# Patient Record
Sex: Female | Born: 1945 | Race: White | Hispanic: No | Marital: Single | Smoking: Never smoker
Health system: Southern US, Community
[De-identification: ages and names within clinical notes are randomized; demographics above are authoritative.]

## PROBLEM LIST (undated history)

## (undated) DIAGNOSIS — Z973 Presence of spectacles and contact lenses: Secondary | ICD-10-CM

## (undated) DIAGNOSIS — I10 Essential (primary) hypertension: Secondary | ICD-10-CM

## (undated) DIAGNOSIS — K219 Gastro-esophageal reflux disease without esophagitis: Secondary | ICD-10-CM

## (undated) DIAGNOSIS — Z8489 Family history of other specified conditions: Secondary | ICD-10-CM

## (undated) DIAGNOSIS — E7800 Pure hypercholesterolemia, unspecified: Secondary | ICD-10-CM

## (undated) DIAGNOSIS — Z974 Presence of external hearing-aid: Secondary | ICD-10-CM

## (undated) HISTORY — PX: HERNIA REPAIR: SHX51

## (undated) HISTORY — PX: TUBAL LIGATION: SHX77

## (undated) HISTORY — PX: VARICOSE VEIN SURGERY: SHX832

## (undated) HISTORY — PX: ABDOMINAL HYSTERECTOMY: SHX81

## (undated) SURGERY — ESOPHAGOGASTRODUODENOSCOPY (EGD) WITH PROPOFOL
Anesthesia: Monitor Anesthesia Care

---

## 2016-09-24 DIAGNOSIS — D1801 Hemangioma of skin and subcutaneous tissue: Secondary | ICD-10-CM | POA: Diagnosis not present

## 2016-09-24 DIAGNOSIS — D229 Melanocytic nevi, unspecified: Secondary | ICD-10-CM | POA: Diagnosis not present

## 2016-09-24 DIAGNOSIS — L309 Dermatitis, unspecified: Secondary | ICD-10-CM | POA: Diagnosis not present

## 2016-11-08 DIAGNOSIS — I10 Essential (primary) hypertension: Secondary | ICD-10-CM | POA: Diagnosis not present

## 2016-11-08 DIAGNOSIS — E785 Hyperlipidemia, unspecified: Secondary | ICD-10-CM | POA: Diagnosis not present

## 2016-11-09 DIAGNOSIS — E785 Hyperlipidemia, unspecified: Secondary | ICD-10-CM | POA: Diagnosis not present

## 2016-11-09 DIAGNOSIS — I10 Essential (primary) hypertension: Secondary | ICD-10-CM | POA: Diagnosis not present

## 2017-02-14 DIAGNOSIS — I10 Essential (primary) hypertension: Secondary | ICD-10-CM | POA: Diagnosis not present

## 2017-02-14 DIAGNOSIS — J309 Allergic rhinitis, unspecified: Secondary | ICD-10-CM | POA: Diagnosis not present

## 2017-02-14 DIAGNOSIS — E785 Hyperlipidemia, unspecified: Secondary | ICD-10-CM | POA: Diagnosis not present

## 2017-02-14 DIAGNOSIS — G4700 Insomnia, unspecified: Secondary | ICD-10-CM | POA: Diagnosis not present

## 2017-02-14 DIAGNOSIS — Z13820 Encounter for screening for osteoporosis: Secondary | ICD-10-CM | POA: Diagnosis not present

## 2017-02-14 DIAGNOSIS — Z0000 Encounter for general adult medical examination without abnormal findings: Secondary | ICD-10-CM | POA: Diagnosis not present

## 2017-02-14 DIAGNOSIS — Z23 Encounter for immunization: Secondary | ICD-10-CM | POA: Diagnosis not present

## 2017-02-14 DIAGNOSIS — Z1231 Encounter for screening mammogram for malignant neoplasm of breast: Secondary | ICD-10-CM | POA: Diagnosis not present

## 2017-02-19 ENCOUNTER — Other Ambulatory Visit: Payer: Self-pay | Admitting: Physician Assistant

## 2017-02-19 DIAGNOSIS — M8580 Other specified disorders of bone density and structure, unspecified site: Secondary | ICD-10-CM

## 2017-02-19 DIAGNOSIS — Z1231 Encounter for screening mammogram for malignant neoplasm of breast: Secondary | ICD-10-CM

## 2017-03-26 ENCOUNTER — Ambulatory Visit
Admission: RE | Admit: 2017-03-26 | Discharge: 2017-03-26 | Disposition: A | Discharge status: Home / Self Care | Payer: PPO | Source: Ambulatory Visit | Attending: Physician Assistant | Admitting: Physician Assistant

## 2017-03-26 DIAGNOSIS — Z1231 Encounter for screening mammogram for malignant neoplasm of breast: Secondary | ICD-10-CM

## 2017-03-26 DIAGNOSIS — M85851 Other specified disorders of bone density and structure, right thigh: Secondary | ICD-10-CM | POA: Diagnosis not present

## 2017-03-26 DIAGNOSIS — Z780 Asymptomatic menopausal state: Secondary | ICD-10-CM | POA: Diagnosis not present

## 2017-03-26 DIAGNOSIS — M8580 Other specified disorders of bone density and structure, unspecified site: Secondary | ICD-10-CM

## 2017-07-10 ENCOUNTER — Other Ambulatory Visit: Payer: Self-pay | Admitting: *Deleted

## 2017-07-10 NOTE — Patient Outreach (Signed)
[facility]) Care Management  07/10/2017  Carmen Alexander 02/23/1946 [id number]   Screening Call  Referral Date 07/10/17 Referral Source : [employer] for HTA Referral reason: Help member get setup with Optometrist for vision screening and nutritional consult.   Unsuccessful attempt to contact patient, able to leave a HIPAA compliant message with return call back number.   Plan Will plan return call within a week.   Joylene Draft, RN, [place] Management Coordinator  [phone]- Mobile [phone]- Toll Free Main Office

## 2017-07-11 ENCOUNTER — Other Ambulatory Visit: Payer: Self-pay | Admitting: *Deleted

## 2017-07-11 ENCOUNTER — Encounter: Payer: Self-pay | Admitting: *Deleted

## 2017-07-11 DIAGNOSIS — I10 Essential (primary) hypertension: Secondary | ICD-10-CM

## 2017-07-11 NOTE — Patient Outreach (Addendum)
[facility]) Care Management  07/11/2017  ADALEAH FORGET [birth year] [id number]  Referral Date 07/10/17 Referral Source : [employer] for HTA Referral reason: Help member get setup with Optometrist for vision screening and nutritional consult.    Patient returned call from outreach on yesterday, HIPAA information verified, explained reason for the call.  Social Patient lives at home with her son and daughter in law, she is independent with her care, she still drives, does the cooking for the family. Patient discussed she moved here about a 11/2 year ago, she as annual visit with PCP.   Conditions  Per patient discussion she reports high blood pressure and elevated cholesterol  as her medical conditions, she has a new blood pressure monitor and to begin checking her blood pressure at home but reports doing well at doctor visits.  Patient discussed she attends nutrition class monthly with her son, and is able to ask any question she wants because she does the cooking at home.   Medications  Patient discussed being on 2 medications , Crestor and Lisinopril/HCTZ, reports no cost concerns with affording  medications.   Annual follow up  Patient discussed she is due for a eye exam in next year and since moving here from [place]  she not had found a doctor yet . Patient states she has seen PCP for visit in spring 2018 and Wellness visit in July, 2018 and has scheduled next visit for spring next year.     Discussed [facility] care management services, discussed patient benefit of care manager for education and support of hypertension , benefit of nutrition counseling available ,  patient declined need for follow up services at this time. Patient states she just needs information of doctors available through her  insurance  on getting a vision exam for  next year    Plan Placed call to [facility] for patient , representative will send patient list of practices available for  eye exam, patient agreeable and  states this will meet her need.Veriifed with patient that she has HTA contact information . Will send successful outreach letter.  Will notify CMA to list patient as not active.     Joylene Draft, RN, [place] Management Coordinator  [phone]- Mobile [phone]- Toll Free Main Office

## 2017-07-12 ENCOUNTER — Ambulatory Visit: Payer: Self-pay | Admitting: *Deleted

## 2017-11-12 DIAGNOSIS — H903 Sensorineural hearing loss, bilateral: Secondary | ICD-10-CM | POA: Diagnosis not present

## 2017-11-27 DIAGNOSIS — H903 Sensorineural hearing loss, bilateral: Secondary | ICD-10-CM | POA: Diagnosis not present

## 2017-11-28 ENCOUNTER — Other Ambulatory Visit: Payer: Self-pay | Admitting: Gastroenterology

## 2017-11-28 ENCOUNTER — Other Ambulatory Visit: Payer: Self-pay

## 2017-11-28 ENCOUNTER — Encounter (HOSPITAL_COMMUNITY): Payer: Self-pay | Admitting: *Deleted

## 2017-11-28 DIAGNOSIS — R112 Nausea with vomiting, unspecified: Secondary | ICD-10-CM | POA: Diagnosis not present

## 2017-11-28 NOTE — Progress Notes (Signed)
Pt denies SOB, chest pain, and being under the care of a cardiologist. Pt made aware to stop taking vitamins, fish oil and herbal medications. Do not take any NSAIDs ie: Ibuprofen, Advil, Naproxen (Aleve), Motrin, BC and Goody Powder. Pt verbalized understanding of all pre-op instructions. 

## 2017-11-29 ENCOUNTER — Ambulatory Visit (HOSPITAL_COMMUNITY): Payer: PPO | Admitting: Certified Registered"

## 2017-11-29 ENCOUNTER — Encounter (HOSPITAL_COMMUNITY)
Admission: RE | Disposition: A | Discharge status: Home / Self Care | Payer: Self-pay | Source: Ambulatory Visit | Attending: Gastroenterology

## 2017-11-29 ENCOUNTER — Other Ambulatory Visit: Payer: Self-pay

## 2017-11-29 ENCOUNTER — Other Ambulatory Visit: Payer: Self-pay | Admitting: Gastroenterology

## 2017-11-29 ENCOUNTER — Encounter (HOSPITAL_COMMUNITY): Payer: Self-pay

## 2017-11-29 ENCOUNTER — Ambulatory Visit (HOSPITAL_COMMUNITY)
Admission: RE | Admit: 2017-11-29 | Discharge: 2017-11-29 | Disposition: A | Discharge status: Home / Self Care | Payer: PPO | Source: Ambulatory Visit | Attending: Gastroenterology | Admitting: Gastroenterology

## 2017-11-29 DIAGNOSIS — K2980 Duodenitis without bleeding: Secondary | ICD-10-CM | POA: Diagnosis not present

## 2017-11-29 DIAGNOSIS — E785 Hyperlipidemia, unspecified: Secondary | ICD-10-CM | POA: Diagnosis not present

## 2017-11-29 DIAGNOSIS — I10 Essential (primary) hypertension: Secondary | ICD-10-CM | POA: Insufficient documentation

## 2017-11-29 DIAGNOSIS — K209 Esophagitis, unspecified: Secondary | ICD-10-CM | POA: Diagnosis not present

## 2017-11-29 DIAGNOSIS — K210 Gastro-esophageal reflux disease with esophagitis: Secondary | ICD-10-CM | POA: Insufficient documentation

## 2017-11-29 DIAGNOSIS — K315 Obstruction of duodenum: Secondary | ICD-10-CM | POA: Insufficient documentation

## 2017-11-29 DIAGNOSIS — K449 Diaphragmatic hernia without obstruction or gangrene: Secondary | ICD-10-CM | POA: Diagnosis not present

## 2017-11-29 DIAGNOSIS — Z79899 Other long term (current) drug therapy: Secondary | ICD-10-CM | POA: Insufficient documentation

## 2017-11-29 DIAGNOSIS — K219 Gastro-esophageal reflux disease without esophagitis: Secondary | ICD-10-CM | POA: Insufficient documentation

## 2017-11-29 DIAGNOSIS — R112 Nausea with vomiting, unspecified: Secondary | ICD-10-CM | POA: Diagnosis not present

## 2017-11-29 HISTORY — DX: Family history of other specified conditions: Z84.89

## 2017-11-29 HISTORY — DX: Presence of spectacles and contact lenses: Z97.3

## 2017-11-29 HISTORY — DX: Presence of external hearing-aid: Z97.4

## 2017-11-29 HISTORY — DX: Essential (primary) hypertension: I10

## 2017-11-29 HISTORY — DX: Pure hypercholesterolemia, unspecified: E78.00

## 2017-11-29 HISTORY — DX: Gastro-esophageal reflux disease without esophagitis: K21.9

## 2017-11-29 HISTORY — PX: ESOPHAGOGASTRODUODENOSCOPY (EGD) WITH PROPOFOL: SHX5813

## 2017-11-29 SURGERY — ESOPHAGOGASTRODUODENOSCOPY (EGD) WITH PROPOFOL
Anesthesia: Monitor Anesthesia Care

## 2017-11-29 MED ORDER — MIDAZOLAM HCL 5 MG/5ML IJ SOLN
INTRAMUSCULAR | Status: DC | PRN
  Administered 2017-11-29: 1 mg via INTRAVENOUS

## 2017-11-29 MED ORDER — FENTANYL CITRATE (PF) 100 MCG/2ML IJ SOLN
INTRAMUSCULAR | Status: DC | PRN
  Administered 2017-11-29: 25 ug via INTRAVENOUS

## 2017-11-29 MED ORDER — BUTAMBEN-TETRACAINE-BENZOCAINE 2-2-14 % EX AERO
INHALATION_SPRAY | CUTANEOUS | Status: DC | PRN
  Administered 2017-11-29: 2 via TOPICAL

## 2017-11-29 MED ORDER — PROPOFOL 10 MG/ML IV BOLUS
INTRAVENOUS | Status: DC | PRN
  Administered 2017-11-29: 20 mg via INTRAVENOUS

## 2017-11-29 MED ORDER — LACTATED RINGERS IV SOLN
INTRAVENOUS | Status: DC
  Administered 2017-11-29: 1000 mL via INTRAVENOUS
  Administered 2017-11-29: 08:00:00 via INTRAVENOUS

## 2017-11-29 MED ORDER — PROPOFOL 500 MG/50ML IV EMUL
INTRAVENOUS | Status: DC | PRN
  Administered 2017-11-29: 50 ug/kg/min via INTRAVENOUS

## 2017-11-29 MED ORDER — ONDANSETRON HCL 4 MG/2ML IJ SOLN
INTRAMUSCULAR | Status: DC | PRN
  Administered 2017-11-29: 4 mg via INTRAVENOUS

## 2017-11-29 SURGICAL SUPPLY — 14 items

## 2017-11-29 NOTE — Anesthesia Postprocedure Evaluation (Signed)
Anesthesia Post Note  Patient: Carmen Alexander  Procedure(s) Performed: ESOPHAGOGASTRODUODENOSCOPY (EGD) WITH PROPOFOL (N/A )     Patient location during evaluation: PACU Anesthesia Type: MAC Level of consciousness: awake and alert Pain management: pain level controlled Vital Signs Assessment: post-procedure vital signs reviewed and stable Respiratory status: spontaneous breathing, nonlabored ventilation, respiratory function stable and patient connected to nasal cannula oxygen Cardiovascular status: stable and blood pressure returned to baseline Postop Assessment: no apparent nausea or vomiting Anesthetic complications: no    Last Vitals:  Vitals:   11/29/17 0935 11/29/17 0943  BP: (!) 173/75 (!) 167/77  Pulse: 73 75  Resp: 13   Temp:    SpO2: 100% 98%    Last Pain:  Vitals:   11/29/17 0940  TempSrc:   PainSc: 0-No pain                 Brittiany Wiehe DAVID

## 2017-11-29 NOTE — Discharge Instructions (Signed)

## 2017-11-29 NOTE — Anesthesia Procedure Notes (Signed)
Performed by: Ossey, Kevin, MD       

## 2017-11-29 NOTE — Consult Note (Signed)
The patient is a 72 year old female who presents to the endoscopy unit today at the hospital for EGD. She was seen in the office yesterday by Dr. Paulita Fujita with complaints of nausea and vomiting over the past few days associated with heartburn and reflux. Endoscopy was recommended to further evaluate. The patient states that she has had ulcers in the past. History and physical from the office reviewed.  Physical no acute distress  Heart regular rhythm no murmurs  Lungs clear  Abdomen soft and nontender  Impression: Nausea vomiting and heartburn  Plan: EGD

## 2017-11-29 NOTE — Anesthesia Procedure Notes (Signed)
Procedure Name: MAC Date/Time: 11/29/2017 8:58 AM Performed by: Lavell Luster, CRNA Pre-anesthesia Checklist: Patient identified, Emergency Drugs available, Suction available, Patient being monitored and Timeout performed Patient Re-evaluated:Patient Re-evaluated prior to induction Oxygen Delivery Method: Nasal cannula Preoxygenation: Pre-oxygenation with 100% oxygen Induction Type: IV induction Placement Confirmation: positive ETCO2 and breath sounds checked- equal and bilateral Dental Injury: Teeth and Oropharynx as per pre-operative assessment

## 2017-11-29 NOTE — Op Note (Signed)
[facility] Patient Name: Carmen Alexander Procedure Date : 11/29/2017 MRN: [id number] Attending MD: Wonda Horner , MD Date of Birth: 10/29/1945 CSN: [id number] Age: 72 Admit Type: Outpatient Procedure:                Upper GI endoscopy Indications:              Nausea with vomiting Providers:                Wonda Horner, MD, Baird Cancer, RN, Elspeth Cho Tech., Technician, Theodoro Grist, CRNA Referring MD:              Medicines:                Propofol per Anesthesia Complications:            No immediate complications. Estimated Blood Loss:     Estimated blood loss was minimal. Procedure:                Pre-Anesthesia Assessment:                           - Prior to the procedure, a History and Physical                            was performed, and patient medications and                            allergies were reviewed. The patient's tolerance of                            previous anesthesia was also reviewed. The risks                            and benefits of the procedure and the sedation                            options and risks were discussed with the patient.                            All questions were answered, and informed consent                            was obtained. Prior Anticoagulants: The patient has                            taken no previous anticoagulant or antiplatelet                            agents. ASA Grade Assessment: II - A patient with                            mild systemic disease. After reviewing the risks  and benefits, the patient was deemed in                            satisfactory condition to undergo the procedure.                           After obtaining informed consent, the endoscope was                            passed under direct vision. Throughout the                            procedure, the patient's blood pressure, pulse, and   oxygen saturations were monitored continuously. The                            EG-[id number] ([id number]) scope was introduced through the                            mouth, and advanced to the duodenal bulb. The upper                            GI endoscopy was accomplished without difficulty.                            The patient tolerated the procedure well. Scope In: Scope Out: Findings:      Severe esophagitis was found. Located in the distal and mid esophagus.       It was ulcerative. Biopsies were taken with a cold forceps for histology.      A medium-sized hiatal hernia was present.      A medium amount of food (residue) was found in the gastric body.      An acquired severe stenosis was found in the first portion of the       duodenum. I could see a small lumen but could not advance the scope       beyond. No ulcer was seen but the duodenal mucosa in the bulb was       somewhat nodular. Biopsies were taken with a cold forceps for histology. Impression:               - Severe reflux esophagitis. Biopsied.                           - Medium-sized hiatal hernia.                           - A medium amount of food (residue) in the stomach.                           - Acquired duodenal stenosis. Biopsied. Recommendation:           - Clear liquid diet.                           - Continue present medications.                           -  Await pathology results.                           - PPI therapy. Patient will get Nexium 24 and take                            qd.                           - Will order a CT scan of abdomen with and without                            contrast. Need to determine extent of stricture and                            also to see if this is from external compression of                            duodenum. Procedure Code(s):        --- Professional ---                           [id number], Esophagogastroduodenoscopy, flexible,                            transoral;  with biopsy, single or multiple Diagnosis Code(s):        --- Professional ---                           K21.0, Gastro-esophageal reflux disease with                            esophagitis                           K44.9, Diaphragmatic hernia without obstruction or                            gangrene                           K31.5, Obstruction of duodenum                           R11.2, Nausea with vomiting, unspecified CPT copyright 2017 American Medical Association. All rights reserved. The codes documented in this report are preliminary and upon coder review may  be revised to meet current compliance requirements. Wonda Horner, MD 11/29/2017 9:27:55 AM This report has been signed electronically. Number of Addenda: 0

## 2017-11-29 NOTE — Transfer of Care (Signed)
Immediate Anesthesia Transfer of Care Note  Patient: Carmen Alexander  Procedure(s) Performed: ESOPHAGOGASTRODUODENOSCOPY (EGD) WITH PROPOFOL (N/A )  Patient Location: Endoscopy Unit  Anesthesia Type:MAC  Level of Consciousness: awake, alert  and sedated  Airway & Oxygen Therapy: Patient connected to nasal cannula oxygen  Post-op Assessment: Post -op Vital signs reviewed and stable  Post vital signs: stable  Last Vitals:  Vitals Value Taken Time  BP    Temp    Pulse    Resp    SpO2      Last Pain:  Vitals:   11/29/17 0755  TempSrc: Oral  PainSc: 0-No pain         Complications: No apparent anesthesia complications

## 2017-11-29 NOTE — Anesthesia Preprocedure Evaluation (Addendum)
Anesthesia Evaluation  Patient identified by MRN, date of birth, ID band Patient awake    Reviewed: Allergy & Precautions, NPO status , Patient's Chart, lab work & pertinent test results  Airway Mallampati: I  TM Distance: >3 FB Neck ROM: Full    Dental   Pulmonary    Pulmonary exam normal        Cardiovascular hypertension, Pt. on medications Normal cardiovascular exam     Neuro/Psych    GI/Hepatic GERD  Medicated and Controlled,  Endo/Other    Renal/GU      Musculoskeletal   Abdominal   Peds  Hematology   Anesthesia Other Findings   Reproductive/Obstetrics                           Anesthesia Physical Anesthesia Plan  ASA: III  Anesthesia Plan: MAC   Post-op Pain Management:    Induction: Intravenous  PONV Risk Score and Plan: 2  Airway Management Planned: Simple Face Mask  Additional Equipment:   Intra-op Plan:   Post-operative Plan:   Informed Consent: I have reviewed the patients History and Physical, chart, labs and discussed the procedure including the risks, benefits and alternatives for the proposed anesthesia with the patient or authorized representative who has indicated his/her understanding and acceptance.     Plan Discussed with: CRNA and Surgeon  Anesthesia Plan Comments:         Anesthesia Quick Evaluation

## 2017-12-01 ENCOUNTER — Encounter (HOSPITAL_COMMUNITY): Payer: Self-pay | Admitting: Gastroenterology

## 2017-12-03 ENCOUNTER — Ambulatory Visit
Admission: RE | Admit: 2017-12-03 | Discharge: 2017-12-03 | Disposition: A | Discharge status: Home / Self Care | Payer: PPO | Source: Ambulatory Visit | Attending: Gastroenterology | Admitting: Gastroenterology

## 2017-12-03 DIAGNOSIS — K8020 Calculus of gallbladder without cholecystitis without obstruction: Secondary | ICD-10-CM | POA: Diagnosis not present

## 2017-12-03 DIAGNOSIS — K315 Obstruction of duodenum: Secondary | ICD-10-CM

## 2017-12-03 MED ORDER — IOPAMIDOL (ISOVUE-300) INJECTION 61%
100.0000 mL | Freq: Once | INTRAVENOUS | Status: AC | PRN
  Administered 2017-12-03: 100 mL via INTRAVENOUS

## 2017-12-05 DIAGNOSIS — K219 Gastro-esophageal reflux disease without esophagitis: Secondary | ICD-10-CM | POA: Diagnosis not present

## 2017-12-05 DIAGNOSIS — K315 Obstruction of duodenum: Secondary | ICD-10-CM | POA: Diagnosis not present

## 2018-01-06 DIAGNOSIS — K315 Obstruction of duodenum: Secondary | ICD-10-CM | POA: Diagnosis not present

## 2018-01-27 DIAGNOSIS — M545 Low back pain: Secondary | ICD-10-CM | POA: Diagnosis not present

## 2018-02-18 DIAGNOSIS — Z23 Encounter for immunization: Secondary | ICD-10-CM | POA: Diagnosis not present

## 2018-02-18 DIAGNOSIS — K219 Gastro-esophageal reflux disease without esophagitis: Secondary | ICD-10-CM | POA: Diagnosis not present

## 2018-02-18 DIAGNOSIS — E785 Hyperlipidemia, unspecified: Secondary | ICD-10-CM | POA: Diagnosis not present

## 2018-02-18 DIAGNOSIS — M8580 Other specified disorders of bone density and structure, unspecified site: Secondary | ICD-10-CM | POA: Diagnosis not present

## 2018-02-18 DIAGNOSIS — I10 Essential (primary) hypertension: Secondary | ICD-10-CM | POA: Diagnosis not present

## 2018-02-18 DIAGNOSIS — Z0000 Encounter for general adult medical examination without abnormal findings: Secondary | ICD-10-CM | POA: Diagnosis not present

## 2018-02-18 DIAGNOSIS — K315 Obstruction of duodenum: Secondary | ICD-10-CM | POA: Diagnosis not present

## 2018-02-18 DIAGNOSIS — R413 Other amnesia: Secondary | ICD-10-CM | POA: Diagnosis not present

## 2018-02-18 DIAGNOSIS — G4700 Insomnia, unspecified: Secondary | ICD-10-CM | POA: Diagnosis not present

## 2018-02-21 DIAGNOSIS — W19XXXA Unspecified fall, initial encounter: Secondary | ICD-10-CM | POA: Diagnosis not present

## 2018-02-21 DIAGNOSIS — R0781 Pleurodynia: Secondary | ICD-10-CM | POA: Diagnosis not present

## 2018-02-21 DIAGNOSIS — Y92481 Parking lot as the place of occurrence of the external cause: Secondary | ICD-10-CM | POA: Diagnosis not present

## 2018-02-21 DIAGNOSIS — S20211A Contusion of right front wall of thorax, initial encounter: Secondary | ICD-10-CM | POA: Diagnosis not present

## 2018-02-24 DIAGNOSIS — Z1212 Encounter for screening for malignant neoplasm of rectum: Secondary | ICD-10-CM | POA: Diagnosis not present

## 2018-02-24 DIAGNOSIS — Z1211 Encounter for screening for malignant neoplasm of colon: Secondary | ICD-10-CM | POA: Diagnosis not present

## 2018-03-03 DIAGNOSIS — K315 Obstruction of duodenum: Secondary | ICD-10-CM | POA: Diagnosis not present

## 2018-03-03 DIAGNOSIS — K449 Diaphragmatic hernia without obstruction or gangrene: Secondary | ICD-10-CM | POA: Diagnosis not present

## 2018-03-03 DIAGNOSIS — K2980 Duodenitis without bleeding: Secondary | ICD-10-CM | POA: Diagnosis not present

## 2018-03-05 DIAGNOSIS — K2980 Duodenitis without bleeding: Secondary | ICD-10-CM | POA: Diagnosis not present

## 2018-04-24 DIAGNOSIS — K315 Obstruction of duodenum: Secondary | ICD-10-CM | POA: Diagnosis not present

## 2018-04-24 DIAGNOSIS — Z1211 Encounter for screening for malignant neoplasm of colon: Secondary | ICD-10-CM | POA: Diagnosis not present

## 2018-04-24 DIAGNOSIS — K219 Gastro-esophageal reflux disease without esophagitis: Secondary | ICD-10-CM | POA: Diagnosis not present

## 2018-04-30 ENCOUNTER — Other Ambulatory Visit: Payer: Self-pay | Admitting: Family Medicine

## 2018-04-30 DIAGNOSIS — Z1231 Encounter for screening mammogram for malignant neoplasm of breast: Secondary | ICD-10-CM

## 2018-06-02 ENCOUNTER — Ambulatory Visit
Admission: RE | Admit: 2018-06-02 | Discharge: 2018-06-02 | Disposition: A | Discharge status: Home / Self Care | Payer: PPO | Source: Ambulatory Visit | Attending: Family Medicine | Admitting: Family Medicine

## 2018-06-02 DIAGNOSIS — Z1231 Encounter for screening mammogram for malignant neoplasm of breast: Secondary | ICD-10-CM | POA: Diagnosis not present

## 2018-08-01 ENCOUNTER — Other Ambulatory Visit: Payer: Self-pay | Admitting: Gastroenterology

## 2018-08-11 ENCOUNTER — Other Ambulatory Visit: Payer: Self-pay | Admitting: Gastroenterology

## 2018-08-12 ENCOUNTER — Encounter (HOSPITAL_COMMUNITY): Payer: Self-pay | Admitting: *Deleted

## 2018-08-12 ENCOUNTER — Other Ambulatory Visit: Payer: Self-pay

## 2018-08-13 ENCOUNTER — Ambulatory Visit (HOSPITAL_COMMUNITY): Admission: RE | Admit: 2018-08-13 | Payer: PPO | Source: Home / Self Care | Admitting: Gastroenterology

## 2018-08-13 ENCOUNTER — Encounter (HOSPITAL_COMMUNITY): Payer: Self-pay | Admitting: Registered Nurse

## 2018-08-13 SURGERY — ESOPHAGOGASTRODUODENOSCOPY (EGD) WITH PROPOFOL
Anesthesia: Monitor Anesthesia Care

## 2018-09-01 ENCOUNTER — Other Ambulatory Visit: Payer: Self-pay | Admitting: Gastroenterology

## 2018-09-03 ENCOUNTER — Ambulatory Visit (HOSPITAL_COMMUNITY): Payer: PPO | Admitting: Anesthesiology

## 2018-09-03 ENCOUNTER — Ambulatory Visit (HOSPITAL_COMMUNITY)
Admission: RE | Admit: 2018-09-03 | Discharge: 2018-09-03 | Disposition: A | Discharge status: Home / Self Care | Payer: PPO | Attending: Gastroenterology | Admitting: Gastroenterology

## 2018-09-03 ENCOUNTER — Encounter (HOSPITAL_COMMUNITY): Payer: Self-pay | Admitting: *Deleted

## 2018-09-03 ENCOUNTER — Encounter (HOSPITAL_COMMUNITY)
Admission: RE | Disposition: A | Discharge status: Home / Self Care | Payer: Self-pay | Source: Home / Self Care | Attending: Gastroenterology

## 2018-09-03 ENCOUNTER — Other Ambulatory Visit: Payer: Self-pay

## 2018-09-03 DIAGNOSIS — K219 Gastro-esophageal reflux disease without esophagitis: Secondary | ICD-10-CM | POA: Insufficient documentation

## 2018-09-03 DIAGNOSIS — Z6839 Body mass index (BMI) 39.0-39.9, adult: Secondary | ICD-10-CM | POA: Insufficient documentation

## 2018-09-03 DIAGNOSIS — I10 Essential (primary) hypertension: Secondary | ICD-10-CM | POA: Insufficient documentation

## 2018-09-03 DIAGNOSIS — K315 Obstruction of duodenum: Secondary | ICD-10-CM | POA: Diagnosis not present

## 2018-09-03 DIAGNOSIS — Z79899 Other long term (current) drug therapy: Secondary | ICD-10-CM | POA: Insufficient documentation

## 2018-09-03 DIAGNOSIS — E785 Hyperlipidemia, unspecified: Secondary | ICD-10-CM | POA: Insufficient documentation

## 2018-09-03 HISTORY — PX: EUS: SHX5427

## 2018-09-03 HISTORY — PX: ESOPHAGOGASTRODUODENOSCOPY (EGD) WITH PROPOFOL: SHX5813

## 2018-09-03 SURGERY — ESOPHAGOGASTRODUODENOSCOPY (EGD) WITH PROPOFOL
Anesthesia: Monitor Anesthesia Care

## 2018-09-03 MED ORDER — PROPOFOL 10 MG/ML IV BOLUS
INTRAVENOUS | Status: AC
  Filled 2018-09-03: qty 60

## 2018-09-03 MED ORDER — LACTATED RINGERS IV SOLN
INTRAVENOUS | Status: DC
  Administered 2018-09-03: 08:00:00 via INTRAVENOUS

## 2018-09-03 MED ORDER — SODIUM CHLORIDE 0.9 % IV SOLN: INTRAVENOUS | Status: DC

## 2018-09-03 MED ORDER — PROPOFOL 500 MG/50ML IV EMUL
INTRAVENOUS | Status: DC | PRN
  Administered 2018-09-03: 125 ug/kg/min via INTRAVENOUS

## 2018-09-03 MED ORDER — PROPOFOL 10 MG/ML IV BOLUS
INTRAVENOUS | Status: DC | PRN
  Administered 2018-09-03 (×3): 20 mg via INTRAVENOUS

## 2018-09-03 SURGICAL SUPPLY — 14 items

## 2018-09-03 NOTE — Op Note (Signed)
[facility] Patient Name: Carmen Alexander Procedure Date: 09/03/2018 MRN: [id number] Attending MD: Arta Silence , MD Date of Birth: [birth year] CSN: [id number] Age: 73 Admit Type: Outpatient Procedure:                Upper EUS Indications:              Follow-up of duodenal stenosis Providers:                Arta Silence, MD, Dorise Hiss, RN, Cleda Daub, RN, Elspeth Cho Tech., Technician, Harle Stanford CRNA, CRNA Referring MD:              Medicines:                Monitored Anesthesia Care Complications:            No immediate complications. Estimated Blood Loss:     Estimated blood loss: none. Procedure:                Pre-Anesthesia Assessment:                           - Prior to the procedure, a History and Physical                            was performed, and patient medications and                            allergies were reviewed. The patient's tolerance of                            previous anesthesia was also reviewed. The risks                            and benefits of the procedure and the sedation                            options and risks were discussed with the patient.                            All questions were answered, and informed consent                            was obtained. Prior Anticoagulants: The patient has                            taken no previous anticoagulant or antiplatelet                            agents. ASA Grade Assessment: III - A patient with  severe systemic disease. After reviewing the risks                            and benefits, the patient was deemed in                            satisfactory condition to undergo the procedure.                           After obtaining informed consent, the endoscope was                            passed under direct vision. Throughout the                            procedure, the patient's  blood pressure, pulse, and                            oxygen saturations were monitored continuously. The                            [id number] ([id number]) Olympus Radial EUS was                            introduced through the mouth, and advanced to the                            duodenal bulb. The upper EUS was accomplished                            without difficulty. The patient tolerated the                            procedure well. Findings:      ENDOSCOPIC FINDING: :      The examined esophagus was normal.      The entire examined stomach was normal.      An acquired benign-appearing, intrinsic mild stenosis was found in the       first portion of the duodenum and was traversed. Still narrow, but 13mm       egd scope (but not larger radial scope) could traverse; this is improved       from prior procedures, when I could not traverse the stricture with the       diagnostic endoscope.      The exam of the duodenum was otherwise normal.      ENDOSONOGRAPHIC FINDING: :      Endosonographic images of the stomach were unremarkable.      There was no sign of significant endosonographic abnormality in the       pancreatic head, genu of the pancreas, pancreatic body and pancreatic       tail.      Thickened duodenal wall, but no findings suggestive of malignancy. Impression:               - Normal esophagus.                           -  Normal stomach.                           - Acquired duodenal stenosis.                           - Endosonographic images of the stomach were                            unremarkable.                           - There was no sign of significant pathology in the                            pancreatic head, genu of the pancreas, pancreatic                            body and pancreatic tail.                           - Significant improvement in duodenal stricture,                            though not resolved; improvement over time, prior                             negative imaging, and today's exam all indicative                            of likely benign etiology. Moderate Sedation:      Not Applicable - Patient had care per Anesthesia. Recommendation:           - Discharge patient to home (via wheelchair).                           - Resume previous diet today.                           - Continue present medications.                           - Return to GI clinic in 3 months. May consider                            repeat imaging in a few months.                           - Return to referring physician as previously                            scheduled. Procedure Code(s):        --- Professional ---                           [phone], Esophagogastroduodenoscopy, flexible,  transoral; with endoscopic ultrasound examination                            limited to the esophagus, stomach or duodenum, and                            adjacent structures Diagnosis Code(s):        --- Professional ---                           K31.5, Obstruction of duodenum CPT copyright 2018 American Medical Association. All rights reserved. The codes documented in this report are preliminary and upon coder review may  be revised to meet current compliance requirements. Arta Silence, MD 09/03/2018 9:25:11 AM This report has been signed electronically. Number of Addenda: 0

## 2018-09-03 NOTE — Transfer of Care (Signed)
Immediate Anesthesia Transfer of Care Note  Patient: Carmen Alexander  Procedure(s) Performed: ESOPHAGOGASTRODUODENOSCOPY (EGD) WITH PROPOFOL (N/A ) ESOPHAGEAL ENDOSCOPIC ULTRASOUND (EUS) RADIAL (N/A )  Patient Location: PACU  Anesthesia Type:MAC  Level of Consciousness: sedated  Airway & Oxygen Therapy: Patient Spontanous Breathing and Patient connected to nasal cannula oxygen  Post-op Assessment: Report given to RN and Post -op Vital signs reviewed and stable  Post vital signs: Reviewed and stable  Last Vitals:  Vitals Value Taken Time  BP    Temp    Pulse    Resp 22 09/03/2018  9:16 AM  SpO2    Vitals shown include unvalidated device data.  Last Pain:  Vitals:   09/03/18 0725  TempSrc: Oral  PainSc: 0-No pain         Complications: No apparent anesthesia complications

## 2018-09-03 NOTE — Discharge Instructions (Signed)

## 2018-09-03 NOTE — H&P (Signed)
Patient interval history reviewed.  Patient examined again.  There has been no change from documented H/P in chart (scanned into chart from our office) except as documented above.  Assessment:  1.  Duodenal stricture.  Clinically doing well.    Plan:  1.  Endoscopy and endoscopic ultrasound for further evaluation. 2.  Risks (bleeding, infection, bowel perforation that could require surgery, sedation-related changes in cardiopulmonary systems), benefits (identification and possible treatment of source of symptoms, exclusion of certain causes of symptoms), and alternatives (watchful waiting, radiographic imaging studies, empiric medical treatment) of upper endoscopy (EGD) and upper endoscopy with ultrasound (EUS +/- FNA) were explained to patient/family in detail and patient wishes to proceed.

## 2018-09-03 NOTE — Anesthesia Postprocedure Evaluation (Signed)
Anesthesia Post Note  Patient: Carmen Alexander  Procedure(s) Performed: ESOPHAGOGASTRODUODENOSCOPY (EGD) WITH PROPOFOL (N/A ) ESOPHAGEAL ENDOSCOPIC ULTRASOUND (EUS) RADIAL (N/A )     Patient location during evaluation: PACU Anesthesia Type: MAC Level of consciousness: awake and alert Pain management: pain level controlled Vital Signs Assessment: post-procedure vital signs reviewed and stable Respiratory status: spontaneous breathing, nonlabored ventilation, respiratory function stable and patient connected to nasal cannula oxygen Cardiovascular status: stable and blood pressure returned to baseline Postop Assessment: no apparent nausea or vomiting Anesthetic complications: no    Last Vitals:  Vitals:   09/03/18 0725 09/03/18 0916  BP: (!) 134/59 (!) 135/55  Pulse: (!) 55   Resp: 13 18  Temp: 36.6 C 36.7 C  SpO2: 100%     Last Pain:  Vitals:   09/03/18 0916  TempSrc: Oral  PainSc: 0-No pain                 Treyvonne Tata S

## 2018-09-03 NOTE — Anesthesia Preprocedure Evaluation (Addendum)
Anesthesia Evaluation  Patient identified by MRN, date of birth, ID band Patient awake    Reviewed: Allergy & Precautions, NPO status , Patient's Chart, lab work & pertinent test results  Airway Mallampati: II  TM Distance: >3 FB Neck ROM: Full    Dental no notable dental hx.    Pulmonary neg pulmonary ROS,    Pulmonary exam normal breath sounds clear to auscultation       Cardiovascular hypertension, Normal cardiovascular exam Rhythm:Regular Rate:Normal     Neuro/Psych negative neurological ROS  negative psych ROS   GI/Hepatic Neg liver ROS, GERD  ,  Endo/Other  Morbid obesity  Renal/GU negative Renal ROS  negative genitourinary   Musculoskeletal negative musculoskeletal ROS (+)   Abdominal   Peds negative pediatric ROS (+)  Hematology negative hematology ROS (+)   Anesthesia Other Findings   Reproductive/Obstetrics negative OB ROS                            Anesthesia Physical Anesthesia Plan  ASA: III  Anesthesia Plan: MAC   Post-op Pain Management:    Induction: Intravenous  PONV Risk Score and Plan: 0  Airway Management Planned: Simple Face Mask  Additional Equipment:   Intra-op Plan:   Post-operative Plan:   Informed Consent: I have reviewed the patients History and Physical, chart, labs and discussed the procedure including the risks, benefits and alternatives for the proposed anesthesia with the patient or authorized representative who has indicated his/her understanding and acceptance.     Dental advisory given  Plan Discussed with: CRNA and Surgeon  Anesthesia Plan Comments:        Anesthesia Quick Evaluation

## 2018-09-04 ENCOUNTER — Encounter (HOSPITAL_COMMUNITY): Payer: Self-pay | Admitting: Gastroenterology

## 2019-01-01 DIAGNOSIS — Z1211 Encounter for screening for malignant neoplasm of colon: Secondary | ICD-10-CM | POA: Diagnosis not present

## 2019-01-01 DIAGNOSIS — K315 Obstruction of duodenum: Secondary | ICD-10-CM | POA: Diagnosis not present

## 2019-02-19 DIAGNOSIS — R112 Nausea with vomiting, unspecified: Secondary | ICD-10-CM | POA: Diagnosis not present

## 2019-02-20 DIAGNOSIS — Z452 Encounter for adjustment and management of vascular access device: Secondary | ICD-10-CM | POA: Diagnosis not present

## 2019-02-20 DIAGNOSIS — Z0189 Encounter for other specified special examinations: Secondary | ICD-10-CM | POA: Diagnosis not present

## 2019-02-20 DIAGNOSIS — A419 Sepsis, unspecified organism: Secondary | ICD-10-CM | POA: Diagnosis not present

## 2019-02-20 DIAGNOSIS — R0602 Shortness of breath: Secondary | ICD-10-CM | POA: Diagnosis not present

## 2019-02-20 DIAGNOSIS — E669 Obesity, unspecified: Secondary | ICD-10-CM | POA: Diagnosis not present

## 2019-02-20 DIAGNOSIS — Z6838 Body mass index (BMI) 38.0-38.9, adult: Secondary | ICD-10-CM | POA: Diagnosis not present

## 2019-02-20 DIAGNOSIS — R7989 Other specified abnormal findings of blood chemistry: Secondary | ICD-10-CM | POA: Diagnosis not present

## 2019-02-20 DIAGNOSIS — K5730 Diverticulosis of large intestine without perforation or abscess without bleeding: Secondary | ICD-10-CM | POA: Diagnosis not present

## 2019-02-20 DIAGNOSIS — K659 Peritonitis, unspecified: Secondary | ICD-10-CM | POA: Diagnosis not present

## 2019-02-20 DIAGNOSIS — R109 Unspecified abdominal pain: Secondary | ICD-10-CM | POA: Diagnosis not present

## 2019-02-20 DIAGNOSIS — E7849 Other hyperlipidemia: Secondary | ICD-10-CM | POA: Diagnosis not present

## 2019-02-20 DIAGNOSIS — R799 Abnormal finding of blood chemistry, unspecified: Secondary | ICD-10-CM | POA: Diagnosis not present

## 2019-02-20 DIAGNOSIS — K269 Duodenal ulcer, unspecified as acute or chronic, without hemorrhage or perforation: Secondary | ICD-10-CM | POA: Diagnosis not present

## 2019-02-20 DIAGNOSIS — K6389 Other specified diseases of intestine: Secondary | ICD-10-CM | POA: Diagnosis not present

## 2019-02-20 DIAGNOSIS — I080 Rheumatic disorders of both mitral and aortic valves: Secondary | ICD-10-CM | POA: Diagnosis not present

## 2019-02-20 DIAGNOSIS — K8020 Calculus of gallbladder without cholecystitis without obstruction: Secondary | ICD-10-CM | POA: Diagnosis not present

## 2019-02-20 DIAGNOSIS — M542 Cervicalgia: Secondary | ICD-10-CM | POA: Diagnosis not present

## 2019-02-20 DIAGNOSIS — K651 Peritoneal abscess: Secondary | ICD-10-CM | POA: Diagnosis not present

## 2019-02-20 DIAGNOSIS — K631 Perforation of intestine (nontraumatic): Secondary | ICD-10-CM | POA: Diagnosis not present

## 2019-02-20 DIAGNOSIS — R600 Localized edema: Secondary | ICD-10-CM | POA: Diagnosis not present

## 2019-02-20 DIAGNOSIS — R918 Other nonspecific abnormal finding of lung field: Secondary | ICD-10-CM | POA: Diagnosis not present

## 2019-02-20 DIAGNOSIS — I517 Cardiomegaly: Secondary | ICD-10-CM | POA: Diagnosis not present

## 2019-02-20 DIAGNOSIS — R7881 Bacteremia: Secondary | ICD-10-CM | POA: Diagnosis not present

## 2019-02-20 DIAGNOSIS — K261 Acute duodenal ulcer with perforation: Secondary | ICD-10-CM | POA: Diagnosis not present

## 2019-02-20 DIAGNOSIS — E872 Acidosis: Secondary | ICD-10-CM | POA: Diagnosis not present

## 2019-02-20 DIAGNOSIS — I10 Essential (primary) hypertension: Secondary | ICD-10-CM | POA: Diagnosis not present

## 2019-02-20 DIAGNOSIS — I519 Heart disease, unspecified: Secondary | ICD-10-CM | POA: Diagnosis not present

## 2019-02-20 DIAGNOSIS — K4090 Unilateral inguinal hernia, without obstruction or gangrene, not specified as recurrent: Secondary | ICD-10-CM | POA: Diagnosis not present

## 2019-02-20 DIAGNOSIS — Z91048 Other nonmedicinal substance allergy status: Secondary | ICD-10-CM | POA: Diagnosis not present

## 2019-02-20 DIAGNOSIS — J986 Disorders of diaphragm: Secondary | ICD-10-CM | POA: Diagnosis not present

## 2019-02-20 DIAGNOSIS — Z79899 Other long term (current) drug therapy: Secondary | ICD-10-CM | POA: Diagnosis not present

## 2019-02-20 DIAGNOSIS — D62 Acute posthemorrhagic anemia: Secondary | ICD-10-CM | POA: Diagnosis not present

## 2019-02-20 DIAGNOSIS — R000 Tachycardia, unspecified: Secondary | ICD-10-CM | POA: Diagnosis not present

## 2019-02-20 DIAGNOSIS — K219 Gastro-esophageal reflux disease without esophagitis: Secondary | ICD-10-CM | POA: Diagnosis not present

## 2019-02-20 DIAGNOSIS — K265 Chronic or unspecified duodenal ulcer with perforation: Secondary | ICD-10-CM | POA: Diagnosis not present

## 2019-02-20 DIAGNOSIS — R188 Other ascites: Secondary | ICD-10-CM | POA: Diagnosis not present

## 2019-02-20 DIAGNOSIS — K8010 Calculus of gallbladder with chronic cholecystitis without obstruction: Secondary | ICD-10-CM | POA: Diagnosis not present

## 2019-02-20 DIAGNOSIS — Z8719 Personal history of other diseases of the digestive system: Secondary | ICD-10-CM | POA: Diagnosis not present

## 2019-02-20 DIAGNOSIS — K275 Chronic or unspecified peptic ulcer, site unspecified, with perforation: Secondary | ICD-10-CM | POA: Diagnosis not present

## 2019-02-20 DIAGNOSIS — R1011 Right upper quadrant pain: Secondary | ICD-10-CM | POA: Diagnosis not present

## 2019-02-20 DIAGNOSIS — R9431 Abnormal electrocardiogram [ECG] [EKG]: Secondary | ICD-10-CM | POA: Diagnosis not present

## 2019-02-20 DIAGNOSIS — J9811 Atelectasis: Secondary | ICD-10-CM | POA: Diagnosis not present

## 2019-03-06 DIAGNOSIS — Z4800 Encounter for change or removal of nonsurgical wound dressing: Secondary | ICD-10-CM | POA: Diagnosis not present

## 2019-07-14 DIAGNOSIS — K219 Gastro-esophageal reflux disease without esophagitis: Secondary | ICD-10-CM | POA: Diagnosis not present

## 2019-07-14 DIAGNOSIS — Z20828 Contact with and (suspected) exposure to other viral communicable diseases: Secondary | ICD-10-CM | POA: Diagnosis not present

## 2019-08-20 DIAGNOSIS — I83813 Varicose veins of bilateral lower extremities with pain: Secondary | ICD-10-CM | POA: Diagnosis not present

## 2019-08-20 DIAGNOSIS — R609 Edema, unspecified: Secondary | ICD-10-CM | POA: Diagnosis not present

## 2019-09-09 DIAGNOSIS — Z1211 Encounter for screening for malignant neoplasm of colon: Secondary | ICD-10-CM | POA: Diagnosis not present

## 2019-09-09 DIAGNOSIS — K315 Obstruction of duodenum: Secondary | ICD-10-CM | POA: Diagnosis not present

## 2019-10-22 DIAGNOSIS — Z1159 Encounter for screening for other viral diseases: Secondary | ICD-10-CM | POA: Diagnosis not present

## 2019-10-23 ENCOUNTER — Ambulatory Visit: Payer: PPO | Attending: Internal Medicine

## 2019-10-23 DIAGNOSIS — Z23 Encounter for immunization: Secondary | ICD-10-CM

## 2019-10-23 NOTE — Progress Notes (Signed)
   Covid-19 Vaccination Clinic  Name:  Carmen Alexander    MRN: [id number] DOB: 28-Sep-1945  10/23/2019  [name] was observed post Covid-19 immunization for 15 minutes without incident. She was provided with Vaccine Information Sheet and instruction to access the V-Safe system.   [name] was instructed to call 911 with any severe reactions post vaccine: Marland Kitchen Difficulty breathing  . Swelling of face and throat  . A fast heartbeat  . A bad rash all over body  . Dizziness and weakness   Immunizations Administered    Name Date Dose VIS Date Route   Pfizer COVID-19 Vaccine 10/23/2019 10:57 AM 0.3 mL 07/10/2019 Intramuscular   Manufacturer: [place]   Lot: [id number]   [place]: [id number]

## 2019-10-27 DIAGNOSIS — K5730 Diverticulosis of large intestine without perforation or abscess without bleeding: Secondary | ICD-10-CM | POA: Diagnosis not present

## 2019-10-27 DIAGNOSIS — Z1211 Encounter for screening for malignant neoplasm of colon: Secondary | ICD-10-CM | POA: Diagnosis not present

## 2019-10-27 DIAGNOSIS — D122 Benign neoplasm of ascending colon: Secondary | ICD-10-CM | POA: Diagnosis not present

## 2019-10-30 DIAGNOSIS — D122 Benign neoplasm of ascending colon: Secondary | ICD-10-CM | POA: Diagnosis not present

## 2019-11-16 ENCOUNTER — Ambulatory Visit: Payer: PPO | Attending: Internal Medicine

## 2019-11-16 DIAGNOSIS — Z23 Encounter for immunization: Secondary | ICD-10-CM

## 2019-11-16 NOTE — Progress Notes (Signed)
   Covid-19 Vaccination Clinic  Name:  Christinamarie Klima    MRN: [id number] DOB: May 28, 1946  11/16/2019  Ms. Guinan was observed post Covid-19 immunization for 15 minutes without incident. She was provided with Vaccine Information Sheet and instruction to access the V-Safe system.   Ms. Huppe was instructed to call 911 with any severe reactions post vaccine: Marland Kitchen Difficulty breathing  . Swelling of face and throat  . A fast heartbeat  . A bad rash all over body  . Dizziness and weakness   Immunizations Administered    Name Date Dose VIS Date Route   Pfizer COVID-19 Vaccine 11/16/2019  3:36 PM 0.3 mL 09/23/2018 Intramuscular   Manufacturer: [place]   Lot: [id number]   [place]: [id number]

## 2019-12-18 DIAGNOSIS — G4700 Insomnia, unspecified: Secondary | ICD-10-CM | POA: Diagnosis not present

## 2019-12-18 DIAGNOSIS — M8580 Other specified disorders of bone density and structure, unspecified site: Secondary | ICD-10-CM | POA: Diagnosis not present

## 2019-12-18 DIAGNOSIS — E785 Hyperlipidemia, unspecified: Secondary | ICD-10-CM | POA: Diagnosis not present

## 2019-12-18 DIAGNOSIS — I10 Essential (primary) hypertension: Secondary | ICD-10-CM | POA: Diagnosis not present

## 2020-02-16 ENCOUNTER — Other Ambulatory Visit: Payer: Self-pay | Admitting: Family Medicine

## 2020-02-16 DIAGNOSIS — E669 Obesity, unspecified: Secondary | ICD-10-CM | POA: Diagnosis not present

## 2020-02-16 DIAGNOSIS — Z1231 Encounter for screening mammogram for malignant neoplasm of breast: Secondary | ICD-10-CM

## 2020-02-16 DIAGNOSIS — E785 Hyperlipidemia, unspecified: Secondary | ICD-10-CM | POA: Diagnosis not present

## 2020-02-16 DIAGNOSIS — M8580 Other specified disorders of bone density and structure, unspecified site: Secondary | ICD-10-CM

## 2020-02-16 DIAGNOSIS — I10 Essential (primary) hypertension: Secondary | ICD-10-CM | POA: Diagnosis not present

## 2020-02-16 DIAGNOSIS — E2839 Other primary ovarian failure: Secondary | ICD-10-CM

## 2020-03-28 DIAGNOSIS — G4700 Insomnia, unspecified: Secondary | ICD-10-CM | POA: Diagnosis not present

## 2020-03-28 DIAGNOSIS — I10 Essential (primary) hypertension: Secondary | ICD-10-CM | POA: Diagnosis not present

## 2020-03-28 DIAGNOSIS — E785 Hyperlipidemia, unspecified: Secondary | ICD-10-CM | POA: Diagnosis not present

## 2020-03-28 DIAGNOSIS — M8580 Other specified disorders of bone density and structure, unspecified site: Secondary | ICD-10-CM | POA: Diagnosis not present

## 2020-05-09 DIAGNOSIS — Z0000 Encounter for general adult medical examination without abnormal findings: Secondary | ICD-10-CM | POA: Diagnosis not present

## 2020-05-09 DIAGNOSIS — Z1389 Encounter for screening for other disorder: Secondary | ICD-10-CM | POA: Diagnosis not present

## 2020-05-13 ENCOUNTER — Ambulatory Visit
Admission: RE | Admit: 2020-05-13 | Discharge: 2020-05-13 | Disposition: A | Discharge status: Home / Self Care | Payer: PPO | Source: Ambulatory Visit | Attending: Family Medicine | Admitting: Family Medicine

## 2020-05-13 ENCOUNTER — Other Ambulatory Visit: Payer: Self-pay

## 2020-05-13 DIAGNOSIS — Z1231 Encounter for screening mammogram for malignant neoplasm of breast: Secondary | ICD-10-CM | POA: Diagnosis not present

## 2020-05-13 DIAGNOSIS — Z780 Asymptomatic menopausal state: Secondary | ICD-10-CM | POA: Diagnosis not present

## 2020-05-13 DIAGNOSIS — E2839 Other primary ovarian failure: Secondary | ICD-10-CM

## 2020-05-13 DIAGNOSIS — M8580 Other specified disorders of bone density and structure, unspecified site: Secondary | ICD-10-CM

## 2020-05-13 DIAGNOSIS — M8589 Other specified disorders of bone density and structure, multiple sites: Secondary | ICD-10-CM | POA: Diagnosis not present

## 2020-06-17 DIAGNOSIS — K219 Gastro-esophageal reflux disease without esophagitis: Secondary | ICD-10-CM | POA: Diagnosis not present

## 2020-06-17 DIAGNOSIS — I10 Essential (primary) hypertension: Secondary | ICD-10-CM | POA: Diagnosis not present

## 2020-06-17 DIAGNOSIS — G4700 Insomnia, unspecified: Secondary | ICD-10-CM | POA: Diagnosis not present

## 2020-06-17 DIAGNOSIS — E785 Hyperlipidemia, unspecified: Secondary | ICD-10-CM | POA: Diagnosis not present

## 2020-06-17 DIAGNOSIS — M8580 Other specified disorders of bone density and structure, unspecified site: Secondary | ICD-10-CM | POA: Diagnosis not present

## 2020-08-23 DIAGNOSIS — M8580 Other specified disorders of bone density and structure, unspecified site: Secondary | ICD-10-CM | POA: Diagnosis not present

## 2020-08-23 DIAGNOSIS — G4700 Insomnia, unspecified: Secondary | ICD-10-CM | POA: Diagnosis not present

## 2020-08-23 DIAGNOSIS — K219 Gastro-esophageal reflux disease without esophagitis: Secondary | ICD-10-CM | POA: Diagnosis not present

## 2020-08-23 DIAGNOSIS — E785 Hyperlipidemia, unspecified: Secondary | ICD-10-CM | POA: Diagnosis not present

## 2020-08-23 DIAGNOSIS — I10 Essential (primary) hypertension: Secondary | ICD-10-CM | POA: Diagnosis not present

## 2020-08-29 DIAGNOSIS — K219 Gastro-esophageal reflux disease without esophagitis: Secondary | ICD-10-CM | POA: Diagnosis not present

## 2020-08-29 DIAGNOSIS — E785 Hyperlipidemia, unspecified: Secondary | ICD-10-CM | POA: Diagnosis not present

## 2020-08-29 DIAGNOSIS — G4700 Insomnia, unspecified: Secondary | ICD-10-CM | POA: Diagnosis not present

## 2020-08-29 DIAGNOSIS — I10 Essential (primary) hypertension: Secondary | ICD-10-CM | POA: Diagnosis not present

## 2020-08-29 DIAGNOSIS — M8580 Other specified disorders of bone density and structure, unspecified site: Secondary | ICD-10-CM | POA: Diagnosis not present

## 2020-10-05 DIAGNOSIS — K219 Gastro-esophageal reflux disease without esophagitis: Secondary | ICD-10-CM | POA: Diagnosis not present

## 2020-10-05 DIAGNOSIS — E785 Hyperlipidemia, unspecified: Secondary | ICD-10-CM | POA: Diagnosis not present

## 2020-10-05 DIAGNOSIS — G4700 Insomnia, unspecified: Secondary | ICD-10-CM | POA: Diagnosis not present

## 2020-10-05 DIAGNOSIS — I10 Essential (primary) hypertension: Secondary | ICD-10-CM | POA: Diagnosis not present

## 2020-10-05 DIAGNOSIS — M8580 Other specified disorders of bone density and structure, unspecified site: Secondary | ICD-10-CM | POA: Diagnosis not present

## 2020-12-08 DIAGNOSIS — I10 Essential (primary) hypertension: Secondary | ICD-10-CM | POA: Diagnosis not present

## 2020-12-08 DIAGNOSIS — G4700 Insomnia, unspecified: Secondary | ICD-10-CM | POA: Diagnosis not present

## 2020-12-08 DIAGNOSIS — K219 Gastro-esophageal reflux disease without esophagitis: Secondary | ICD-10-CM | POA: Diagnosis not present

## 2020-12-08 DIAGNOSIS — E785 Hyperlipidemia, unspecified: Secondary | ICD-10-CM | POA: Diagnosis not present

## 2020-12-08 DIAGNOSIS — M8580 Other specified disorders of bone density and structure, unspecified site: Secondary | ICD-10-CM | POA: Diagnosis not present

## 2021-02-03 DIAGNOSIS — E785 Hyperlipidemia, unspecified: Secondary | ICD-10-CM | POA: Diagnosis not present

## 2021-02-03 DIAGNOSIS — K219 Gastro-esophageal reflux disease without esophagitis: Secondary | ICD-10-CM | POA: Diagnosis not present

## 2021-02-03 DIAGNOSIS — G4700 Insomnia, unspecified: Secondary | ICD-10-CM | POA: Diagnosis not present

## 2021-02-03 DIAGNOSIS — M8580 Other specified disorders of bone density and structure, unspecified site: Secondary | ICD-10-CM | POA: Diagnosis not present

## 2021-02-03 DIAGNOSIS — I10 Essential (primary) hypertension: Secondary | ICD-10-CM | POA: Diagnosis not present

## 2021-02-20 DIAGNOSIS — G4700 Insomnia, unspecified: Secondary | ICD-10-CM | POA: Diagnosis not present

## 2021-02-20 DIAGNOSIS — I10 Essential (primary) hypertension: Secondary | ICD-10-CM | POA: Diagnosis not present

## 2021-02-20 DIAGNOSIS — M8580 Other specified disorders of bone density and structure, unspecified site: Secondary | ICD-10-CM | POA: Diagnosis not present

## 2021-02-20 DIAGNOSIS — K219 Gastro-esophageal reflux disease without esophagitis: Secondary | ICD-10-CM | POA: Diagnosis not present

## 2021-02-20 DIAGNOSIS — M25562 Pain in left knee: Secondary | ICD-10-CM | POA: Diagnosis not present

## 2021-02-20 DIAGNOSIS — E785 Hyperlipidemia, unspecified: Secondary | ICD-10-CM | POA: Diagnosis not present

## 2021-02-23 DIAGNOSIS — E785 Hyperlipidemia, unspecified: Secondary | ICD-10-CM | POA: Diagnosis not present

## 2021-02-23 DIAGNOSIS — I10 Essential (primary) hypertension: Secondary | ICD-10-CM | POA: Diagnosis not present

## 2021-02-23 DIAGNOSIS — K219 Gastro-esophageal reflux disease without esophagitis: Secondary | ICD-10-CM | POA: Diagnosis not present

## 2021-04-04 DIAGNOSIS — M25562 Pain in left knee: Secondary | ICD-10-CM | POA: Diagnosis not present

## 2021-04-10 DIAGNOSIS — E785 Hyperlipidemia, unspecified: Secondary | ICD-10-CM | POA: Diagnosis not present

## 2021-04-10 DIAGNOSIS — M8580 Other specified disorders of bone density and structure, unspecified site: Secondary | ICD-10-CM | POA: Diagnosis not present

## 2021-04-10 DIAGNOSIS — I10 Essential (primary) hypertension: Secondary | ICD-10-CM | POA: Diagnosis not present

## 2021-04-10 DIAGNOSIS — K219 Gastro-esophageal reflux disease without esophagitis: Secondary | ICD-10-CM | POA: Diagnosis not present

## 2021-04-10 DIAGNOSIS — G4700 Insomnia, unspecified: Secondary | ICD-10-CM | POA: Diagnosis not present

## 2021-05-15 DIAGNOSIS — Z1389 Encounter for screening for other disorder: Secondary | ICD-10-CM | POA: Diagnosis not present

## 2021-05-15 DIAGNOSIS — Z0000 Encounter for general adult medical examination without abnormal findings: Secondary | ICD-10-CM | POA: Diagnosis not present

## 2021-07-16 IMAGING — MG DIGITAL SCREENING BILAT W/ TOMO W/ CAD
8 of 14 series · 8 of 40 positions shown · non-contrast
Comparison: Previous exam(s).

CLINICAL DATA: Screening.

EXAM:
DIGITAL SCREENING BILATERAL MAMMOGRAM WITH TOMO AND CAD

[R CC synth-2D (1 of 2)]
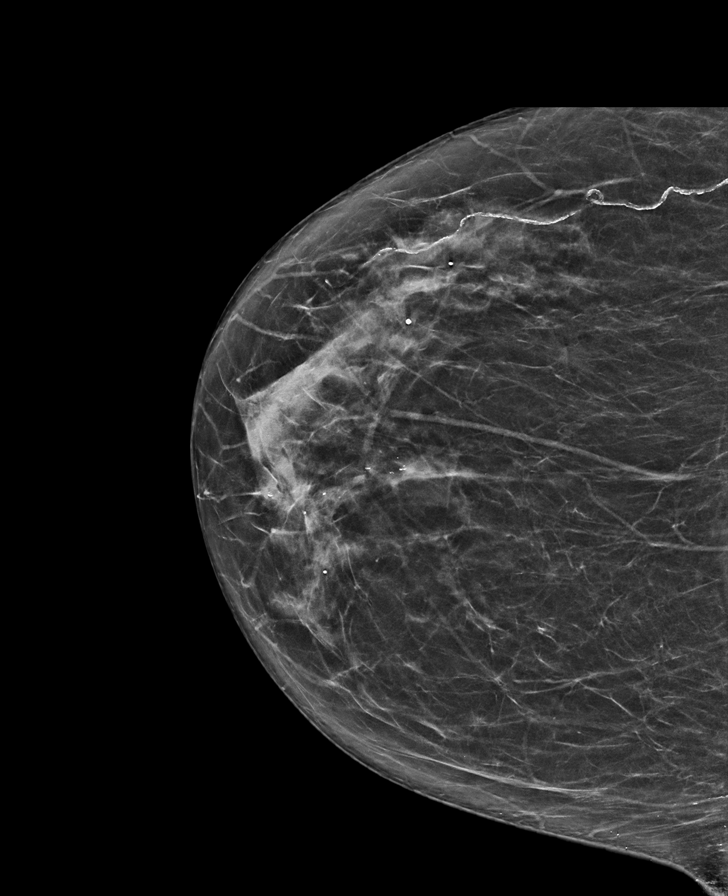

[L CC synth-2D (1 of 2)]
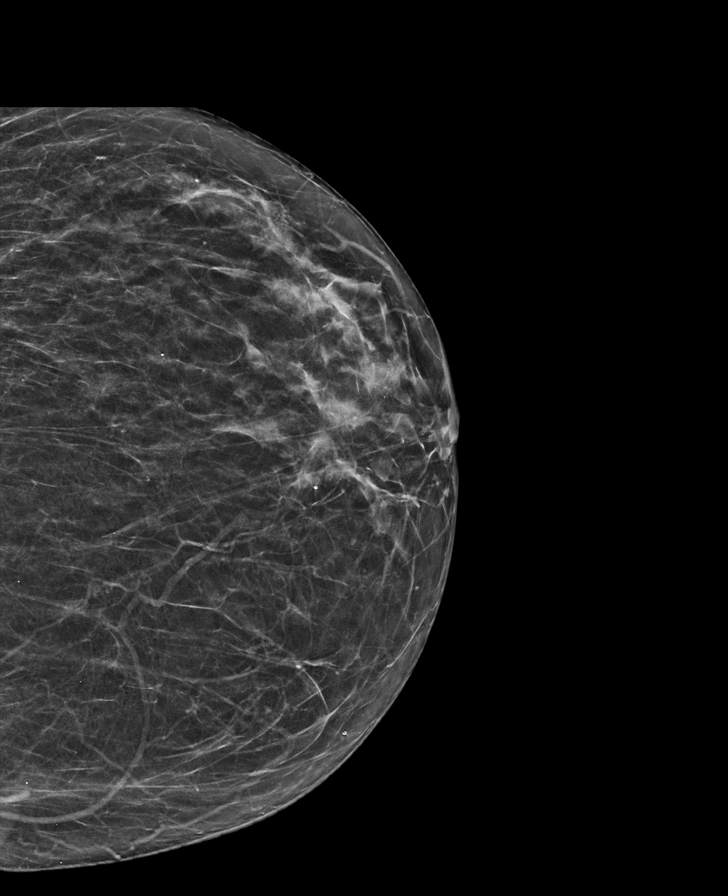

[L CC synth-2D (2 of 2)]
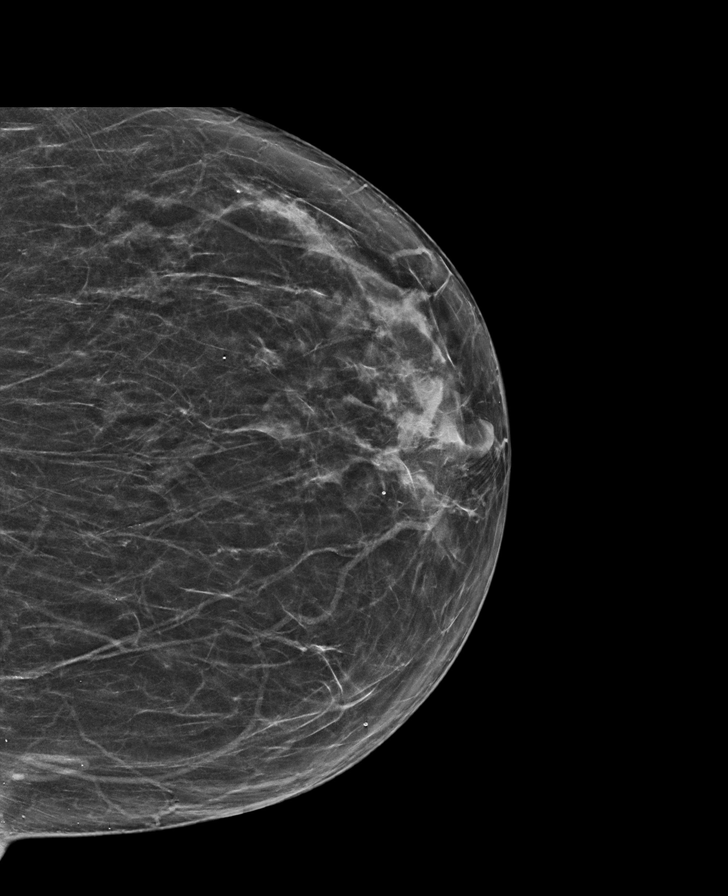

[R MLO synth-2D (1 of 2)]
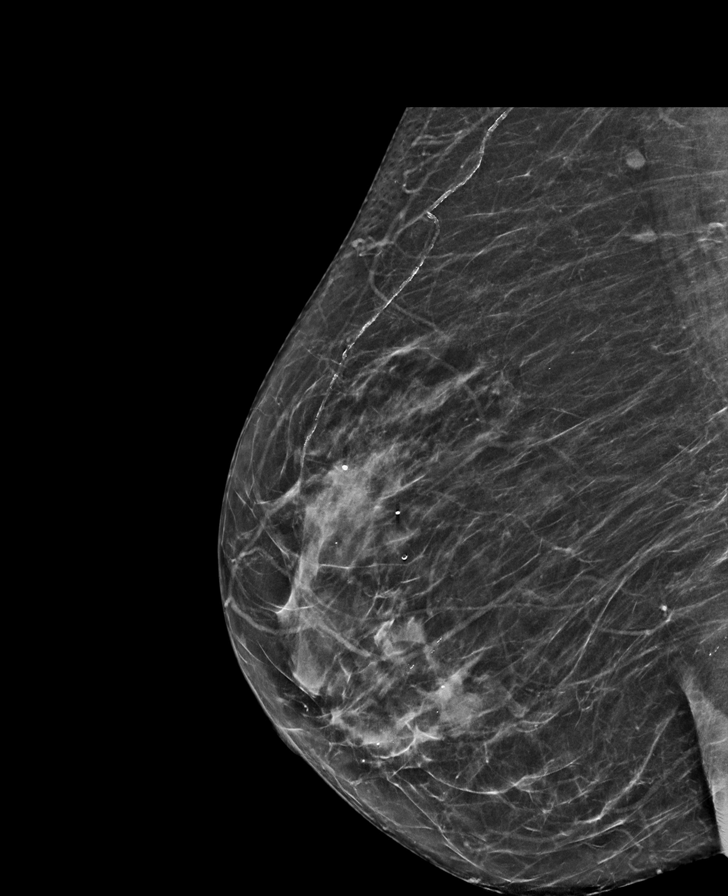

[L MLO synth-2D]
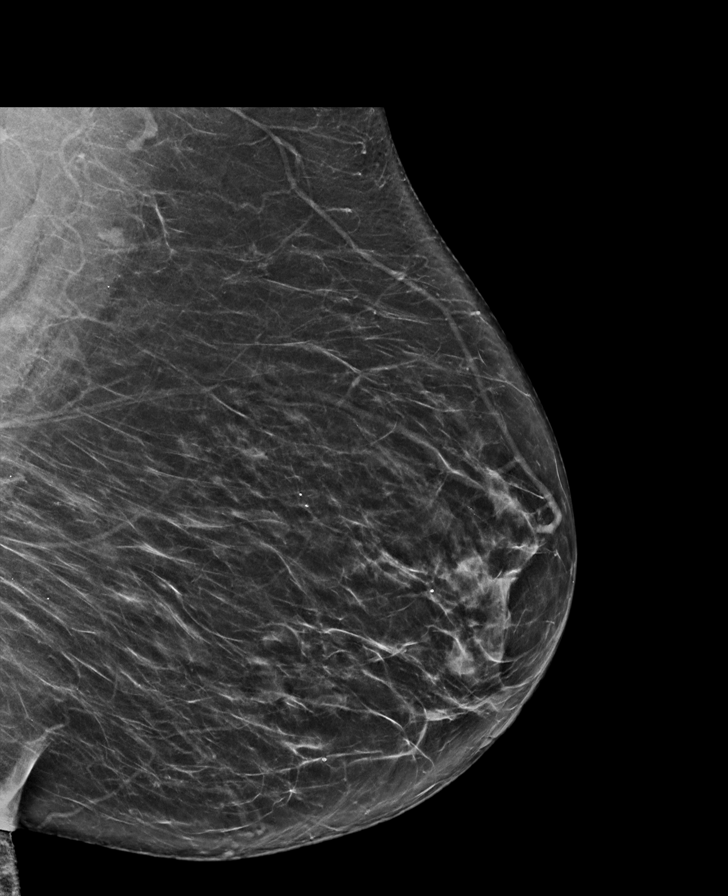

[R CC synth-2D (2 of 2)]
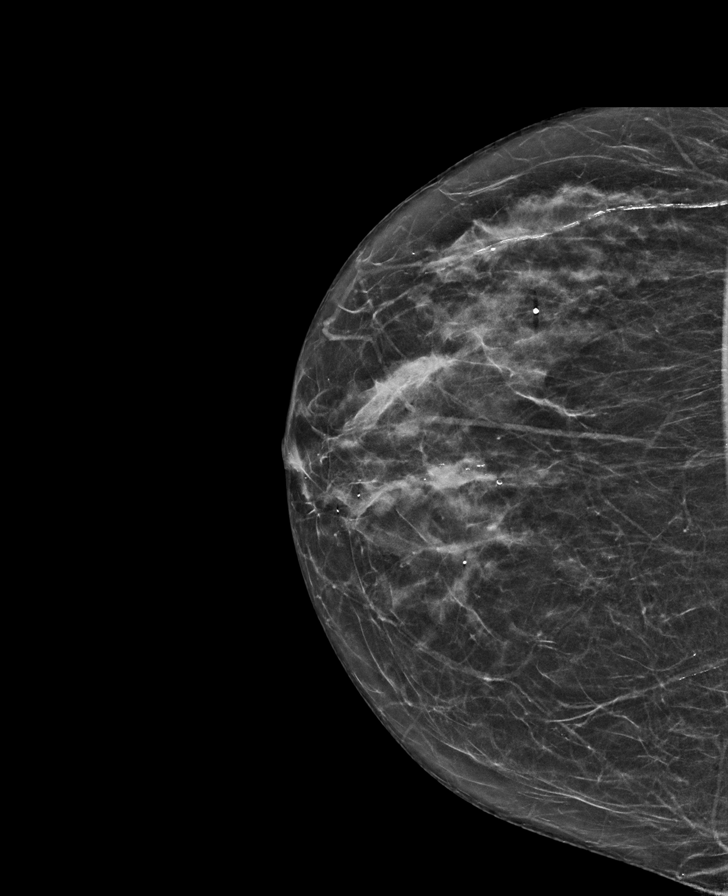

[R MLO synth-2D (2 of 2)]
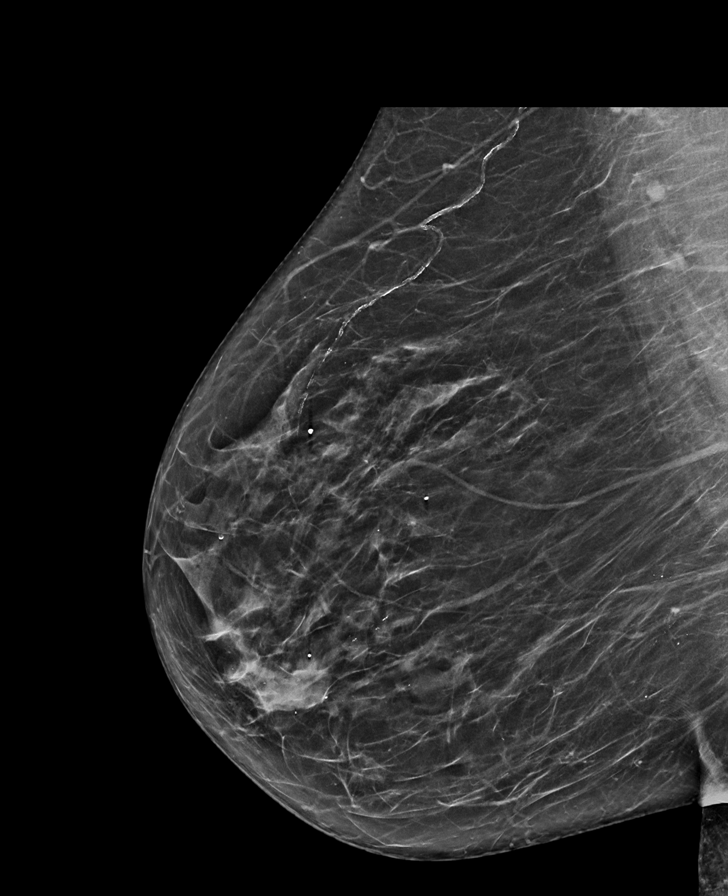

[L MLO tomo · tomo slice 37/72.0]
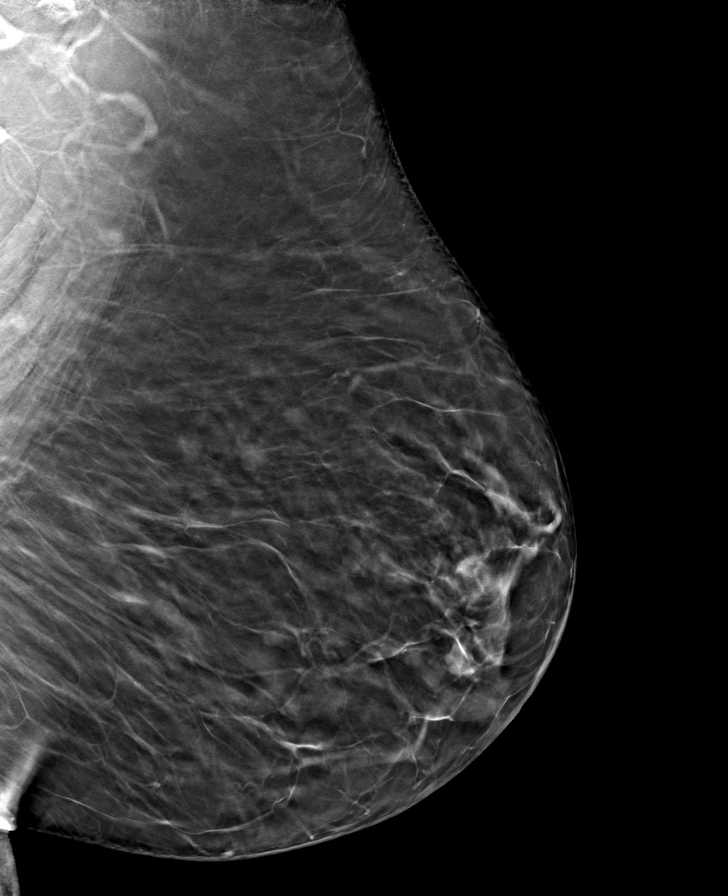

[8 of 40 positions shown; findings below may reference images not displayed]

ACR Breast Density Category b: There are scattered areas of
fibroglandular density.
FINDINGS: There are no findings suspicious for malignancy. Images were
processed with CAD.
IMPRESSION: No mammographic evidence of malignancy. A result letter of this
screening mammogram will be mailed directly to the patient.

RECOMMENDATION:
Screening mammogram in one year. (Code:[id number])

BI-RADS CATEGORY  1: Negative.

## 2021-07-18 DIAGNOSIS — M62830 Muscle spasm of back: Secondary | ICD-10-CM | POA: Diagnosis not present

## 2022-05-16 DIAGNOSIS — Z1389 Encounter for screening for other disorder: Secondary | ICD-10-CM | POA: Diagnosis not present

## 2022-05-16 DIAGNOSIS — Z0000 Encounter for general adult medical examination without abnormal findings: Secondary | ICD-10-CM | POA: Diagnosis not present

## 2022-06-07 DIAGNOSIS — Z6838 Body mass index (BMI) 38.0-38.9, adult: Secondary | ICD-10-CM | POA: Diagnosis not present

## 2022-06-07 DIAGNOSIS — I10 Essential (primary) hypertension: Secondary | ICD-10-CM | POA: Diagnosis not present

## 2022-06-07 DIAGNOSIS — M8580 Other specified disorders of bone density and structure, unspecified site: Secondary | ICD-10-CM | POA: Diagnosis not present

## 2022-06-07 DIAGNOSIS — E785 Hyperlipidemia, unspecified: Secondary | ICD-10-CM | POA: Diagnosis not present

## 2022-06-07 DIAGNOSIS — K219 Gastro-esophageal reflux disease without esophagitis: Secondary | ICD-10-CM | POA: Diagnosis not present

## 2022-06-22 DIAGNOSIS — Z1231 Encounter for screening mammogram for malignant neoplasm of breast: Secondary | ICD-10-CM | POA: Diagnosis not present

## 2023-05-20 DIAGNOSIS — Z23 Encounter for immunization: Secondary | ICD-10-CM | POA: Diagnosis not present

## 2023-05-20 DIAGNOSIS — Z6841 Body Mass Index (BMI) 40.0 and over, adult: Secondary | ICD-10-CM | POA: Diagnosis not present

## 2023-05-20 DIAGNOSIS — Z0000 Encounter for general adult medical examination without abnormal findings: Secondary | ICD-10-CM | POA: Diagnosis not present

## 2023-05-20 DIAGNOSIS — Z1389 Encounter for screening for other disorder: Secondary | ICD-10-CM | POA: Diagnosis not present

## 2023-06-07 DIAGNOSIS — M8580 Other specified disorders of bone density and structure, unspecified site: Secondary | ICD-10-CM | POA: Diagnosis not present

## 2023-06-07 DIAGNOSIS — I10 Essential (primary) hypertension: Secondary | ICD-10-CM | POA: Diagnosis not present

## 2023-06-07 DIAGNOSIS — E785 Hyperlipidemia, unspecified: Secondary | ICD-10-CM | POA: Diagnosis not present

## 2023-06-07 DIAGNOSIS — Z6841 Body Mass Index (BMI) 40.0 and over, adult: Secondary | ICD-10-CM | POA: Diagnosis not present

## 2023-06-07 DIAGNOSIS — K219 Gastro-esophageal reflux disease without esophagitis: Secondary | ICD-10-CM | POA: Diagnosis not present

## 2023-07-12 DIAGNOSIS — N289 Disorder of kidney and ureter, unspecified: Secondary | ICD-10-CM | POA: Diagnosis not present

## 2024-01-03 DIAGNOSIS — I10 Essential (primary) hypertension: Secondary | ICD-10-CM | POA: Diagnosis not present

## 2024-01-27 DIAGNOSIS — E785 Hyperlipidemia, unspecified: Secondary | ICD-10-CM | POA: Diagnosis not present

## 2024-01-27 DIAGNOSIS — I10 Essential (primary) hypertension: Secondary | ICD-10-CM | POA: Diagnosis not present

## 2024-02-01 DIAGNOSIS — I10 Essential (primary) hypertension: Secondary | ICD-10-CM | POA: Diagnosis not present

## 2024-02-27 DIAGNOSIS — I10 Essential (primary) hypertension: Secondary | ICD-10-CM | POA: Diagnosis not present

## 2024-02-27 DIAGNOSIS — E785 Hyperlipidemia, unspecified: Secondary | ICD-10-CM | POA: Diagnosis not present

## 2024-03-02 DIAGNOSIS — I10 Essential (primary) hypertension: Secondary | ICD-10-CM | POA: Diagnosis not present

## 2024-03-29 DIAGNOSIS — I10 Essential (primary) hypertension: Secondary | ICD-10-CM | POA: Diagnosis not present

## 2024-03-29 DIAGNOSIS — E785 Hyperlipidemia, unspecified: Secondary | ICD-10-CM | POA: Diagnosis not present

## 2024-03-31 DIAGNOSIS — R92323 Mammographic fibroglandular density, bilateral breasts: Secondary | ICD-10-CM | POA: Diagnosis not present

## 2024-03-31 DIAGNOSIS — Z1231 Encounter for screening mammogram for malignant neoplasm of breast: Secondary | ICD-10-CM | POA: Diagnosis not present

## 2024-04-01 DIAGNOSIS — I10 Essential (primary) hypertension: Secondary | ICD-10-CM | POA: Diagnosis not present

## 2024-04-28 DIAGNOSIS — E785 Hyperlipidemia, unspecified: Secondary | ICD-10-CM | POA: Diagnosis not present

## 2024-04-28 DIAGNOSIS — I10 Essential (primary) hypertension: Secondary | ICD-10-CM | POA: Diagnosis not present

## 2024-05-01 DIAGNOSIS — I10 Essential (primary) hypertension: Secondary | ICD-10-CM | POA: Diagnosis not present

## 2024-05-29 DIAGNOSIS — E785 Hyperlipidemia, unspecified: Secondary | ICD-10-CM | POA: Diagnosis not present

## 2024-05-29 DIAGNOSIS — I10 Essential (primary) hypertension: Secondary | ICD-10-CM | POA: Diagnosis not present

## 2024-05-31 DIAGNOSIS — I10 Essential (primary) hypertension: Secondary | ICD-10-CM | POA: Diagnosis not present

## 2024-06-11 DIAGNOSIS — Z1331 Encounter for screening for depression: Secondary | ICD-10-CM | POA: Diagnosis not present

## 2024-06-11 DIAGNOSIS — Z0000 Encounter for general adult medical examination without abnormal findings: Secondary | ICD-10-CM | POA: Diagnosis not present

## 2024-06-11 DIAGNOSIS — J309 Allergic rhinitis, unspecified: Secondary | ICD-10-CM | POA: Diagnosis not present

## 2024-06-11 DIAGNOSIS — Z23 Encounter for immunization: Secondary | ICD-10-CM | POA: Diagnosis not present

## 2024-06-11 DIAGNOSIS — K219 Gastro-esophageal reflux disease without esophagitis: Secondary | ICD-10-CM | POA: Diagnosis not present

## 2024-06-11 DIAGNOSIS — M8580 Other specified disorders of bone density and structure, unspecified site: Secondary | ICD-10-CM | POA: Diagnosis not present

## 2024-06-11 DIAGNOSIS — I10 Essential (primary) hypertension: Secondary | ICD-10-CM | POA: Diagnosis not present

## 2024-06-11 DIAGNOSIS — E785 Hyperlipidemia, unspecified: Secondary | ICD-10-CM | POA: Diagnosis not present

## 2024-06-16 ENCOUNTER — Other Ambulatory Visit (HOSPITAL_BASED_OUTPATIENT_CLINIC_OR_DEPARTMENT_OTHER): Payer: Self-pay | Admitting: Family Medicine

## 2024-06-16 DIAGNOSIS — M8588 Other specified disorders of bone density and structure, other site: Secondary | ICD-10-CM

## 2024-06-28 DIAGNOSIS — I10 Essential (primary) hypertension: Secondary | ICD-10-CM | POA: Diagnosis not present

## 2024-06-28 DIAGNOSIS — E785 Hyperlipidemia, unspecified: Secondary | ICD-10-CM | POA: Diagnosis not present

## 2024-09-08 ENCOUNTER — Other Ambulatory Visit (HOSPITAL_BASED_OUTPATIENT_CLINIC_OR_DEPARTMENT_OTHER)

## 2024-09-29 ENCOUNTER — Other Ambulatory Visit (HOSPITAL_BASED_OUTPATIENT_CLINIC_OR_DEPARTMENT_OTHER)
